# Patient Record
Sex: Female | Born: 1990 | Race: White | Hispanic: No | Marital: Single | Smoking: Never smoker
Health system: Southern US, Community
[De-identification: ages and names within clinical notes are randomized; demographics above are authoritative.]

---

## 2011-11-08 HISTORY — PX: OTHER SURGICAL HISTORY: SHX169

## 2013-12-28 ENCOUNTER — Encounter (HOSPITAL_COMMUNITY): Payer: Self-pay | Admitting: Emergency Medicine

## 2013-12-28 ENCOUNTER — Emergency Department (HOSPITAL_COMMUNITY): Payer: Managed Care, Other (non HMO)

## 2013-12-28 ENCOUNTER — Emergency Department (HOSPITAL_COMMUNITY)
Admission: EM | Admit: 2013-12-28 | Discharge: 2013-12-28 | Disposition: A | Discharge status: Home / Self Care | Payer: Managed Care, Other (non HMO) | Attending: Emergency Medicine | Admitting: Emergency Medicine

## 2013-12-28 DIAGNOSIS — S060X0A Concussion without loss of consciousness, initial encounter: Secondary | ICD-10-CM | POA: Insufficient documentation

## 2013-12-28 DIAGNOSIS — Z3202 Encounter for pregnancy test, result negative: Secondary | ICD-10-CM | POA: Insufficient documentation

## 2013-12-28 DIAGNOSIS — Z79899 Other long term (current) drug therapy: Secondary | ICD-10-CM | POA: Insufficient documentation

## 2013-12-28 DIAGNOSIS — R110 Nausea: Secondary | ICD-10-CM | POA: Insufficient documentation

## 2013-12-28 LAB — POC URINE PREG, ED: Preg Test, Ur: NEGATIVE

## 2013-12-28 MED ORDER — IBUPROFEN 800 MG PO TABS
800.0000 mg | ORAL_TABLET | Freq: Once | ORAL | Status: AC
  Administered 2013-12-28: 800 mg via ORAL
  Filled 2013-12-28: qty 1
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
## 2013-12-28 NOTE — Discharge Instructions (Signed)
Read the information below.  You may return to the Emergency Department at any time for worsening condition or any new symptoms that concern you.  You may take tylenol or ibuprofen as needed for pain.  If you decide to press charges, please call the police; if you feel unsafe at any time, please call 911 or you may return to the ER.  Use the resources below if you find them helpful.  If you develop uncontrolled pain, vomiting, are unable to speak or walk, you pass out, or develop weakness or numbness of the arms or legs, return to the ER immediately for a recheck.     Assault, General Assault includes any behavior, whether intentional or reckless, which results in bodily injury to another person and/or damage to property. Included in this would be any behavior, intentional or reckless, that by its nature would be understood (interpreted) by a reasonable person as intent to harm another person or to damage his/her property. Threats may be oral or written. They may be communicated through regular mail, computer, fax, or phone. These threats may be direct or implied. FORMS OF ASSAULT INCLUDE:  Physically assaulting a person. This includes physical threats to inflict physical harm as well as:  Slapping.  Hitting.  Poking.  Kicking.  Punching.  Pushing.  Arson.  Sabotage.  Equipment vandalism.  Damaging or destroying property.  Throwing or hitting objects.  Displaying a weapon or an object that appears to be a weapon in a threatening manner.  Carrying a firearm of any kind.  Using a weapon to harm someone.  Using greater physical size/strength to intimidate another.  Making intimidating or threatening gestures.  Bullying.  Hazing.  Intimidating, threatening, hostile, or abusive language directed toward another person.  It communicates the intention to engage in violence against that person. And it leads a reasonable person to expect that violent behavior may  occur.  Stalking another person. IF IT HAPPENS AGAIN:  Immediately call for emergency help (911 in [place].S[place]  If someone poses clear and immediate danger to you, seek legal authorities to have a protective or restraining order put in place.  Less threatening assaults can at least be reported to authorities. STEPS TO TAKE IF A SEXUAL ASSAULT HAS HAPPENED  Go to an area of safety. This may include a shelter or staying with a friend. Stay away from the area where you have been attacked. A large percentage of sexual assaults are caused by a friend, relative or associate.  If medications were given by your caregiver, take them as directed for the full length of time prescribed.  Only take over-the-counter or prescription medicines for pain, discomfort, or fever as directed by your caregiver.  If you have come in contact with a sexual disease, find out if you are to be tested again. If your caregiver is concerned about the HIV/AIDS virus, he/she may require you to have continued testing for several months.  For the protection of your privacy, test results can not be given over the phone. Make sure you receive the results of your test. If your test results are not back during your visit, make an appointment with your caregiver to find out the results. Do not assume everything is normal if you have not heard from your caregiver or the medical facility. It is important for you to follow up on all of your test results.  File appropriate papers with authorities. This is important in all assaults, even if it has occurred in a  family or by a friend. SEEK MEDICAL CARE IF:  You have new problems because of your injuries.  You have problems that may be because of the medicine you are taking, such as:  Rash.  Itching.  Swelling.  Trouble breathing.  You develop belly (abdominal) pain, feel sick to your stomach (nausea) or are vomiting.  You begin to run a temperature.  You need supportive care  or referral to a rape crisis center. These are centers with trained personnel who can help you get through this ordeal. SEEK IMMEDIATE MEDICAL CARE IF:  You are afraid of being threatened, beaten, or abused. In [place]., call 911.  You receive new injuries related to abuse.  You develop severe pain in any area injured in the assault or have any change in your condition that concerns you.  You faint or lose consciousness.  You develop chest pain or shortness of breath. Document Released: 10/24/2005 Document Revised: 01/16/2012 Document Reviewed: 06/11/2008 [facility] Patient Information 2014 [place], [place].  Concussion, Adult A concussion, or closed-head injury, is a brain injury caused by a direct blow to the head or by a quick and sudden movement (jolt) of the head or neck. Concussions are usually not life-threatening. Even so, the effects of a concussion can be serious. If you have had a concussion before, you are more likely to experience concussion-like symptoms after a direct blow to the head.  CAUSES   Direct blow to the head, such as from running into another player during a soccer game, being hit in a fight, or hitting your head on a hard surface.  A jolt of the head or neck that causes the brain to move back and forth inside the skull, such as in a car crash. SIGNS AND SYMPTOMS  The signs of a concussion can be hard to notice. Early on, they may be missed by you, family members, and health care providers. You may look fine but act or feel differently. Symptoms are usually temporary, but they may last for days, weeks, or even longer. Some symptoms may appear right away while others may not show up for hours or days. Every head injury is different. Symptoms include:   Mild to moderate headaches that will not go away.  A feeling of pressure inside your head.  Having more trouble than usual:   Learning or remembering things you have heard.  Answering questions.  Paying  attention or concentrating.   Organizing daily tasks.   Making decisions and solving problems.   Slowness in thinking, acting or reacting, speaking, or reading.   Getting lost or being easily confused.   Feeling tired all the time or lacking energy (fatigued).   Feeling drowsy.   Sleep disturbances.   Sleeping more than usual.   Sleeping less than usual.   Trouble falling asleep.   Trouble sleeping (insomnia).   Loss of balance or feeling lightheaded or dizzy.   Nausea or vomiting.   Numbness or tingling.   Increased sensitivity to:   Sounds.   Lights.   Distractions.   Vision problems or eyes that tire easily.   Diminished sense of taste or smell.   Ringing in the ears.   Mood changes such as feeling sad or anxious.   Becoming easily irritated or angry for little or no reason.   Lack of motivation.  Seeing or hearing things other people do not see or hear (hallucinations). DIAGNOSIS  Your health care provider can usually diagnose a concussion based on a  description of your injury and symptoms. He or she will ask whether you passed out (lost consciousness) and whether you are having trouble remembering events that happened right before and during your injury.  Your evaluation might include:   A brain scan to look for signs of injury to the brain. Even if the test shows no injury, you may still have a concussion.   Blood tests to be sure other problems are not present. TREATMENT   Concussions are usually treated in an emergency department, in urgent care, or at a clinic. You may need to stay in the hospital overnight for further treatment.   Tell your health care provider if you are taking any medicines, including prescription medicines, over-the-counter medicines, and natural remedies. Some medicines, such as blood thinners (anticoagulants) and aspirin, may increase the chance of complications. Also tell your health care provider  whether you have had alcohol or are taking illegal drugs. This information may affect treatment.  Your health care provider will send you home with important instructions to follow.  How fast you will recover from a concussion depends on many factors. These factors include how severe your concussion is, what part of your brain was injured, your age, and how healthy you were before the concussion.  Most people with mild injuries recover fully. Recovery can take time. In general, recovery is slower in older persons. Also, persons who have had a concussion in the past or have other medical problems may find that it takes longer to recover from their current injury. HOME CARE INSTRUCTIONS  General Instructions  Carefully follow the directions your health care provider gave you.  Only take over-the-counter or prescription medicines for pain, discomfort, or fever as directed by your health care provider.  Take only those medicines that your health care provider has approved.  Do not drink alcohol until your health care provider says you are well enough to do so. Alcohol and certain other drugs may slow your recovery and can put you at risk of further injury.  If it is harder than usual to remember things, write them down.  If you are easily distracted, try to do one thing at a time. For example, do not try to watch TV while fixing dinner.  Talk with family members or close friends when making important decisions.  Keep all follow-up appointments. Repeated evaluation of your symptoms is recommended for your recovery.  Watch your symptoms and tell others to do the same. Complications sometimes occur after a concussion. Older adults with a brain injury may have a higher risk of serious complications such as of a blood clot on the brain.  Tell your teachers, school nurse, school counselor, coach, athletic trainer, or work Production designer, theatre/television/film about your injury, symptoms, and restrictions. Tell them about what  you can or cannot do. They should watch for:   Increased problems with attention or concentration.   Increased difficulty remembering or learning new information.   Increased time needed to complete tasks or assignments.   Increased irritability or decreased ability to cope with stress.   Increased symptoms.   Rest. Rest helps the brain to heal. Make sure you:  Get plenty of sleep at night. Avoid staying up late at night.  Keep the same bedtime hours on weekends and weekdays.  Rest during the day. Take daytime naps or rest breaks when you feel tired.  Limit activities that require a lot of thought or concentration. These includes   Doing homework or job-related work.  Watching TV.   Working on the computer.  Avoid any situation where there is potential for another head injury (football, hockey, soccer, basketball, martial arts, downhill snow sports and horseback riding). Your condition will get worse every time you experience a concussion. You should avoid these activities until you are evaluated by the appropriate follow-up caregivers. Returning To Your Regular Activities You will need to return to your normal activities slowly, not all at once. You must give your body and brain enough time for recovery.  Do not return to sports or other athletic activities until your health care provider tells you it is safe to do so.  Ask your health care provider when you can drive, ride a bicycle, or operate heavy machinery. Your ability to react may be slower after a brain injury. Never do these activities if you are dizzy.  Ask your health care provider about when you can return to work or school. Preventing Another Concussion It is very important to avoid another brain injury, especially before you have recovered. In rare cases, another injury can lead to permanent brain damage, brain swelling, or death. The risk of this is greatest during the first 7 10 days after a head  injury. Avoid injuries by:   Wearing a seat belt when riding in a car.   Drinking alcohol only in moderation.   Wearing a helmet when biking, skiing, skateboarding, skating, or doing similar activities.  Avoiding activities that could lead to a second concussion, such as contact or recreational sports, until your health care provider says it is OK.  Taking safety measures in your home.   Remove clutter and tripping hazards from floors and stairways.   Use grab bars in bathrooms and handrails by stairs.   Place non-slip mats on floors and in bathtubs.   Improve lighting in dim areas. SEEK MEDICAL CARE IF:   You have increased problems paying attention or concentrating.   You have increased difficulty remembering or learning new information.   You need more time to complete tasks or assignments than before.   You have increased irritability or decreased ability to cope with stress.  You have more symptoms than before. Seek medical care if you have any of the following symptoms for more than 2 weeks after your injury:   Lasting (chronic) headaches.   Dizziness or balance problems.   Nausea.  Vision problems.   Increased sensitivity to noise or light.   Depression or mood swings.   Anxiety or irritability.   Memory problems.   Difficulty concentrating or paying attention.   Sleep problems.   Feeling tired all the time. SEEK IMMEDIATE MEDICAL CARE IF:   You have severe or worsening headaches. These may be a sign of a blood clot in the brain.  You have weakness (even if only in one hand, leg, or part of the face).  You have numbness.  You have decreased coordination.   You vomit repeatedly.  You have increased sleepiness.  One pupil is larger than the other.   You have convulsions.   You have slurred speech.   You have increased confusion. This may be a sign of a blood clot in the brain.  You have increased restlessness,  agitation, or irritability.   You are unable to recognize people or places.   You have neck pain.   It is difficult to wake you up.   You have unusual behavior changes.   You lose consciousness. MAKE SURE YOU:   Understand  these instructions.  Will watch your condition.  Will get help right away if you are not doing well or get worse. Document Released: 01/14/2004 Document Revised: 06/26/2013 Document Reviewed: 05/16/2013 [facility] Patient Information 2014 [place], [place].   Behavioral Health Resources in the [facility]  Intensive Outpatient Programs: [facility]      [address] [place], [place] [phone] Both a day and evening program       [facility] Outpatient     [address]        [place], [place] [address] [phone]         ADS: [facility] [address] [place] [phone]  [facility] Mental Health ACCESS LINE: [phone] or [phone] [address] [place], [place] [address] [email or web address]  Mobile Crisis Teams:                                        Therapeutic Alternatives         Mobile Crisis Care Unit [phone]             [facility] [address] Ginette Otto [phone]                                         Interventionist [address] De[place] [place] [phone]  Self-Help/Support Groups: Mental Health Assoc. of [employer] of support groups [phone] (call for more info)  Narcotics Anonymous (NA) Caring Services [address] [place] - 2 meetings at this location  Residential Treatment Programs:  [facility]      [address]        [place]       [phone]         [facility] [address], [place] [address] [place], [place]  [address] [phone]  [facility]  [address] [place], [place] [address] [phone] Admissions: 8am-3pm M-F  [facility]     [address]        [place], [place] [address]       [phone]         [facility] [address] [place], [place] [phone]  [facility] [address] [place], [place] [phone]  [facility] - Intensive Outpatient      [address]     [place], [place]       [phone]         [facility] (Addiction [facility]     [address] [place], [place] [phone] or [phone]  [facility] Treatment Services (RTS)  [address] [place], [place] [phone]  [facility] [address]                                               [address] [place] [phone]  [facility] Resources: [phone]               General Therapy  Angie Fava, PhD        [address] [place], [place] [address]         [phone]   Insurance  [facility]   [address] [place], [place] [address] [phone]  [facility] Recovery [address] [place], [place] [address] [phone] Insurance/Medicaid/sponsorship through [facility]                                              [address]                                        [place], [place] [address]    Therapy/tele-psych/case         [phone]          [facility] ([address]) [address], [place]  [address]  Adolescent/group home/case management [phone]                                           Creola Corn PhD       General therapy       Insurance   [phone]         Dr. Lolly Mustache Insurance [phone] M-F  [facility]/Residential Medicaid, sponsorship [phone]    Emergency Department Resource Guide 1) Find a Doctor and Pay Out of Pocket Although you won't  have to find out who is covered by your insurance plan, it is a good idea to ask around and get recommendations. You will then need to call the office and see if the doctor you have chosen will accept you as a new patient and what types of options they offer for patients who are self-pay. Some doctors offer discounts or will set up payment plans for their patients who do not have insurance, but you will need to ask so you aren't surprised when you get to your appointment.  2) Contact Your Local Health Department Not all health departments have doctors that can see patients for sick visits, but many do, so it is worth a call to see if yours does. If you don't know where your local health department is, you can check in your phone book. The CDC also has a tool to help you locate your state's health department, and many state websites also have listings of all of their local health departments.  3) Find a Walk-in Clinic If your illness is not likely to be very severe or complicated, you may want to try a walk in clinic. These are popping up all over the country in pharmacies, drugstores, and shopping centers. They're usually staffed by nurse practitioners or physician assistants that have been trained to treat common illnesses and complaints. They're usually fairly quick and inexpensive. However, if you have serious medical issues or chronic medical problems, these are probably not your best option.  No Primary Care Doctor: - Call Health Connect at  [phone] - they  can help you locate a primary care doctor that  accepts your insurance, provides certain services, etc. - Physician Referral Service- [phone]  Chronic Pain Problems: Organization         Address  Phone   Notes  [facility]  [phone] Patients need to be referred by their primary care doctor.   Medication Assistance: Organization         Address  Phone   Notes  [facility] Medication [facility]  [address] [place] [place], [place] [address] [phone] --Must be a resident of [facility] -- Must have NO insurance coverage whatsoever (no Medicaid/ Medicare, etc.) -- The pt. MUST have a primary care doctor that directs their care regularly and follows them in the community   Med[facility]ist  [phone]   [employer]  [phone]    Agencies that provide inexpensive medical care: Organization         Address  Phone   Notes  [facility]  [phone]   [facility]    [phone]   [facility] [address] [place], [place] [address] [phone]   [facility] [address] [place], [place] [phone]   Planned Parenthood    [phone]   [facility]    [phone]   [facility]  [address], [place] Phone:  [phone], Fax:  [phone] Hours of Operation:  9 am - 6 pm, M-F.  Also accepts Medicaid/Medicare and self-pay.  [facility]  [address], [place] Phone: [phone], Fax: [phone]. Hours of Operation:  8:30 am - 5:30 pm, M-F.  Also accepts Medicaid and self-pay.  [facility] [place] [address], [place] Phone: [phone]   [facility] [address] Natasha Bence [place], [place] [phone], Ext. [phone] Mondays & Thursdays: 7-9 AM.  First 15 patients are seen on a first come, first serve basis.    Medicaid-accepting [facility] Providers:  Organization         Address  Phone   Notes  [facility] [address], [place] [phone] Also accepts self-pay patients.  [facility] [address] Laurell Josephs [place], [place]  [phone]   [facility] [place] [facility] [address], [place] [phone]   [facility] [address] [place] [phone]    Renaye Rakers [address], [place]   [phone] Only accepts [place] Access [place] patients after they have their name applied to their card.   Self-Pay (no insurance) in [facility]:  Organization         Address  Phone   Notes  Sickle Cell Patients, [facility] [address] [place], [place] [phone]   [facility] [address] [place], [place] [phone]   [facility]  [address], [place] [phone]   [facility]/Dr. Osei-Bonsu  [address] [place] or [address], [place] [phone] Phone number for both [place] and [place] locations is the same.  [facility]gent Medical and [facility] [address], [place] [phone]   [facility] [address] [place] or [address] [phone]   [facility] [address], [place] [phone], phone; [phone], fax Sees patients 1st and 3rd Saturday of every month.  Must not qualify for public or private insurance (i.e. Medicaid, Medicare, [place] Health Choice, Veterans' Benefits)  Household income should be no more than 200% of the poverty level The clinic cannot treat you if you are pregnant or think you are pregnant  Sexually transmitted diseases are not treated at the clinic.    Dental Care: Organization         Address  Phone  Notes  [facility] Department of [facility] [address] [place], [place] [phone] Accepts children up to age 33 who are enrolled in [place] or [place] Health Choice; pregnant women with a Medicaid card; and children who have applied for Medicaid or [place] Health Choice, but were declined, whose parents can pay a reduced fee at time of service.  [facility] Department of [facility]  [address], [place] [phone] Accepts children  up to age 64 who are enrolled in [place] or [place] Health Choice; pregnant women with a Medicaid card; and children who have applied for Medicaid or [place] Health Choice, but were declined, whose parents can pay a reduced fee at time of service.  [facility]  [address] [place], [place] [phone] Patients are seen by appointment only. Walk-ins are not accepted. [facility] will see patients 2 years of age and older. Monday - Tuesday (8am-5pm) Most Wednesdays (8:30-5pm) $30 per visit, cash only  [facility]  [address], [facility] [phone] Patients are seen by appointment only. Walk-ins are not accepted. [facility] will see patients 19 years of age and older. One Wednesday Evening (Monthly: Volunteer Based).  $30 per visit, cash only  [employer]  [phone] for adults; Children under age 101, call Graduate Pediatric Dentistry at [phone]. Children aged 57-14, please call [phone] to request a pediatric application.  Dental services are provided in all areas of dental care including fillings, crowns and bridges, complete and partial dentures, implants, gum treatment, root canals, and extractions. Preventive care is also provided. Treatment is provided to both adults and children. Patients are selected via a lottery and there is often a waiting list.   [facility] [address] [place]  [phone] [email or web address]   [facility] [address] [place], [place] [phone], Ext. [phone] Second and Fourth Thursday of each month, opens at 6:30 AM; Clinic ends at 9 AM.  Patients are seen on a first-come first-served basis, and a limited number are seen during each clinic.   [facility]  [address] [place], [place] [phone]   Eligibility Requirements You must have lived in [place], [place], or [place] counties for at least the last  three months.   You cannot be eligible for state or federal sponsored [place], including [employer], [place], or [employer].   You generally cannot be eligible for healthcare insurance through your employer.    How to apply: Eligibility screenings are held every Tuesday and Wednesday afternoon from 1:00 pm until 4:00 pm. You do not need an appointment for the interview!  [facility] [address], [place], [place] [phone]   [facility] Health Department  [phone]   [facility]  Department  [phone]   [facility] Health Department  [phone]    Behavioral Health Resources in the Community: Intensive Outpatient Programs Organization         Address  Phone  Notes  [facility] [address], [place], [place] [phone]   [facility] Outpatient [address], [place], [place] [phone]   [facility] [address] [place], [place]  [phone]   [facility] Mental Health [address]  [place], [place] [phone] or [phone]   Substance Abuse Resources Organization         Address  Phone  Notes  [facility]  [phone]   [facility]  [phone]   [facility] [place]  [phone]   Floydene Flock  [phone]   Residential & Outpatient Substance Abuse Program  [phone]   Psychological Services Organization         Address  Phone  Notes  [facility] Behavioral Health  [phone]   [facility] Services  [phone]   [facility] Mental Health [address] [place] [phone] or [phone]    Mobile Crisis Teams Organization         Address  Phone  Notes  [facility], Mobile Crisis Care Unit  [phone]   [facility]  [address] [place], [place] [phone]   Doristine Locks [address] [place] [phone]     Self-Help/Support Groups Organization         Address  Phone             Notes  [facility] [place] - variety of support groups  336- I7437963 Call for more information  Narcotics Anonymous (NA), [facility] [address], [employer] [place]  2 meetings at this location   Statistician         Address  Phone  Notes  [facility] [address],    [place]  [phone]   [facility]  [address] [place] [address], [place], [place] [phone]   [facility] [address] [place], [place] [phone] Admissions: 8am-3pm M-F  [facility] [address]    [place], [place] [phone]   [facility] [address] [place], [place], [place] [phone]   [facility] [address],  [place], [place] [phone]   [facility] - Intensive Outpatient [address], [place], [place] [phone]   [facility] ([facility] [address]  [place], [place] [phone] or [phone]   [facility] [address], [place], [place] [phone] Accepts Medicaid  [facility] [address],  [place] [phone] Substance Abuse/Addiction Treatment   [facility] Organization         Address  Phone  Notes  [facility]  [phone]   Angie Fava, PhD [address] [place], [place]   [phone] or [phone]   [facility] Behavioral   [address] [place], [place] [phone]   [facility] [address], [place], [place] [phone] Insurance/Medicaid/sponsorship through [employer] and Families [address]  [place], [place] [phone] [place], [place] [phone]    Dr. Adele Schilder  [phone]   [facility] Dept. 1) [address] [place] 2) [place] 3)  [place] [address], [place] [phone]  [phone]   [place] [phone] or [phone] (After Hours)

## 2013-12-28 NOTE — ED Notes (Signed)
She is awake and mildly drowsy and is oriented x 4 with clear speech.  Her only request at this time is for water, a glass of which I bring her.
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
## 2013-12-29 NOTE — ED Provider Notes (Signed)
Medical screening examination/treatment/procedure(s) were performed by non-physician practitioner and as supervising physician I was immediately available for consultation/collaboration.   Sunnie NielsenBrian Temari Schooler, MD 12/29/13 [phone]

## 2014-10-27 ENCOUNTER — Other Ambulatory Visit (HOSPITAL_COMMUNITY)
Admission: RE | Admit: 2014-10-27 | Discharge: 2014-10-27 | Disposition: A | Discharge status: Home / Self Care | Payer: Managed Care, Other (non HMO) | Source: Ambulatory Visit | Attending: Obstetrics and Gynecology | Admitting: Obstetrics and Gynecology

## 2014-10-27 ENCOUNTER — Other Ambulatory Visit: Payer: Self-pay | Admitting: Obstetrics and Gynecology

## 2014-10-27 DIAGNOSIS — Z113 Encounter for screening for infections with a predominantly sexual mode of transmission: Secondary | ICD-10-CM | POA: Insufficient documentation

## 2014-10-27 DIAGNOSIS — Z01411 Encounter for gynecological examination (general) (routine) with abnormal findings: Secondary | ICD-10-CM | POA: Insufficient documentation

## 2014-10-28 LAB — CYTOLOGY - PAP

## 2015-08-27 ENCOUNTER — Other Ambulatory Visit: Payer: Self-pay | Admitting: Family Medicine

## 2015-08-27 ENCOUNTER — Ambulatory Visit
Admission: RE | Admit: 2015-08-27 | Discharge: 2015-08-27 | Disposition: A | Discharge status: Home / Self Care | Payer: BC Managed Care – PPO | Source: Ambulatory Visit | Attending: Family Medicine | Admitting: Family Medicine

## 2015-08-27 DIAGNOSIS — M5489 Other dorsalgia: Secondary | ICD-10-CM

## 2015-08-27 DIAGNOSIS — M419 Scoliosis, unspecified: Secondary | ICD-10-CM

## 2015-12-28 ENCOUNTER — Other Ambulatory Visit: Payer: Self-pay | Admitting: Obstetrics and Gynecology

## 2015-12-28 ENCOUNTER — Other Ambulatory Visit (HOSPITAL_COMMUNITY)
Admission: RE | Admit: 2015-12-28 | Discharge: 2015-12-28 | Disposition: A | Discharge status: Home / Self Care | Payer: BC Managed Care – PPO | Source: Ambulatory Visit | Attending: Obstetrics and Gynecology | Admitting: Obstetrics and Gynecology

## 2015-12-28 DIAGNOSIS — Z01411 Encounter for gynecological examination (general) (routine) with abnormal findings: Secondary | ICD-10-CM | POA: Diagnosis present

## 2015-12-28 DIAGNOSIS — R87810 Cervical high risk human papillomavirus (HPV) DNA test positive: Secondary | ICD-10-CM | POA: Insufficient documentation

## 2015-12-28 DIAGNOSIS — Z1151 Encounter for screening for human papillomavirus (HPV): Secondary | ICD-10-CM | POA: Diagnosis present

## 2015-12-31 LAB — CYTOLOGY - PAP

## 2016-02-01 ENCOUNTER — Other Ambulatory Visit: Payer: Self-pay | Admitting: Obstetrics and Gynecology

## 2016-05-24 ENCOUNTER — Other Ambulatory Visit: Payer: Self-pay | Admitting: Family Medicine

## 2016-05-24 ENCOUNTER — Ambulatory Visit
Admission: RE | Admit: 2016-05-24 | Discharge: 2016-05-24 | Disposition: A | Discharge status: Home / Self Care | Payer: BC Managed Care – PPO | Source: Ambulatory Visit | Attending: Family Medicine | Admitting: Family Medicine

## 2016-05-24 DIAGNOSIS — M79672 Pain in left foot: Secondary | ICD-10-CM

## 2016-05-24 DIAGNOSIS — M25572 Pain in left ankle and joints of left foot: Secondary | ICD-10-CM

## 2016-06-19 ENCOUNTER — Emergency Department (HOSPITAL_COMMUNITY)
Admission: EM | Admit: 2016-06-19 | Discharge: 2016-06-20 | Disposition: A | Discharge status: Home / Self Care | Payer: BC Managed Care – PPO | Attending: Emergency Medicine | Admitting: Emergency Medicine

## 2016-06-19 ENCOUNTER — Encounter (HOSPITAL_COMMUNITY): Payer: Self-pay

## 2016-06-19 ENCOUNTER — Emergency Department (HOSPITAL_COMMUNITY): Payer: BC Managed Care – PPO

## 2016-06-19 DIAGNOSIS — Y929 Unspecified place or not applicable: Secondary | ICD-10-CM | POA: Insufficient documentation

## 2016-06-19 DIAGNOSIS — S0990XA Unspecified injury of head, initial encounter: Secondary | ICD-10-CM | POA: Insufficient documentation

## 2016-06-19 DIAGNOSIS — Y999 Unspecified external cause status: Secondary | ICD-10-CM | POA: Diagnosis not present

## 2016-06-19 DIAGNOSIS — Y939 Activity, unspecified: Secondary | ICD-10-CM | POA: Insufficient documentation

## 2016-06-19 DIAGNOSIS — Z79899 Other long term (current) drug therapy: Secondary | ICD-10-CM | POA: Insufficient documentation

## 2016-06-19 DIAGNOSIS — W01119A Fall on same level from slipping, tripping and stumbling with subsequent striking against unspecified sharp object, initial encounter: Secondary | ICD-10-CM | POA: Diagnosis not present

## 2016-06-19 NOTE — ED Triage Notes (Signed)
Pt states that she tripped in her bathroom and hit her head on the faucet around 1100. Denies any bleeding but endorses dizziness, nausea, and headache that has not gotten better. Pt took some naproxen at home without relief. A&Ox4. Pupils equal and reactive in triage.

## 2016-06-19 NOTE — ED Provider Notes (Signed)
[facility]-EMERGENCY DEPT Provider Note   CSN: [id number] Arrival date & time: 06/19/16  2002   By signing my name below, I, Phillis Haggis, attest that this documentation has been prepared under the direction and in the presence of Geoffery Lyons, MD. Electronically Signed: Phillis Haggis, ED Scribe. 06/19/16. 11:24 PM.  First Provider Contact:  First MD Initiated Contact with Patient 06/19/16 2319     History   Chief Complaint Chief Complaint  Patient presents with  . Fall  . Dizziness   The history is provided by the patient. No language interpreter was used.  HPI Comments: Yesenia Tucker is a 25 y.o. female who presents to the Emergency Department complaining of gradually worsening dizziness onset 12 hours ago. Pt reports associated headache and nausea. She states that she tripped in the bathroom and hit the left temple of her head on a faucet. She has been taking Naproxen for her symptoms to no relief. She denies LOC, wound, numbness, or weakness.   History reviewed. No pertinent past medical history.  There are no active problems to display for this patient.   Past Surgical History:  Procedure Laterality Date  . wisdom teeth Bilateral 2013   x 2    OB History    No data available     Home Medications    Prior to Admission medications   Medication Sig Start Date End Date Taking? Authorizing Provider  BIOTIN PO Take 1 tablet by mouth daily.    Historical Provider, MD  etonogestrel-ethinyl estradiol (NUVARING) 0.12-0.015 MG/24HR vaginal ring Place 1 each vaginally every 28 (twenty-eight) days. Insert vaginally and leave in place for 3 consecutive weeks, then remove for 1 week.    Historical Provider, MD  ferrous sulfate 325 (65 FE) MG tablet Take 325 mg by mouth daily.    Historical Provider, MD  methylphenidate (RITALIN) 20 MG tablet Take 20 mg by mouth daily.    Historical Provider, MD  Multiple Vitamin (MULTIVITAMIN WITH MINERALS) TABS tablet Take 1 tablet by mouth daily.     Historical Provider, MD    Family History History reviewed. No pertinent family history.  Social History Social History  Substance Use Topics  . Smoking status: Never Smoker  . Smokeless tobacco: Never Used  . Alcohol use Yes     Allergies   Review of patient's allergies indicates no known allergies.   Review of Systems Review of Systems  Gastrointestinal: Positive for nausea.  Skin: Negative for wound.  Neurological: Positive for dizziness and headaches. Negative for syncope, weakness and numbness.  All other systems reviewed and are negative.   Physical Exam Updated Vital Signs BP 118/65 (BP Location: Left Arm)   Pulse 72   Temp 99.3 F (37.4 C) (Oral)   Resp 20   Ht  (1.575 m)   Wt 205 lb (93 kg)   SpO2 100%   BMI 37.49 kg/m   Physical Exam  Constitutional: She is oriented to person, place, and time. She appears well-developed and well-nourished. No distress.  HENT:  Head: Normocephalic.  Mild soft tissue swelling over the left temple  Eyes: EOM are normal. Pupils are equal, round, and reactive to light.  Neck: Normal range of motion.  Cardiovascular: Normal rate, regular rhythm and normal heart sounds.   Pulmonary/Chest: Effort normal and breath sounds normal.  Abdominal: Soft. She exhibits no distension. There is no tenderness.  Musculoskeletal: Normal range of motion.  Neurological: She is alert and oriented to person, place, and time. She  has normal strength. No cranial nerve deficit or sensory deficit. She exhibits normal muscle tone. Coordination normal.  Skin: Skin is warm and dry.  Psychiatric: She has a normal mood and affect. Judgment normal.  Nursing note and vitals reviewed.    ED Treatments / Results  DIAGNOSTIC STUDIES: Oxygen Saturation is 100% on RA, normal by my interpretation.    COORDINATION OF CARE: 11:23 PM-Discussed treatment plan which includes CT scan with pt at bedside and pt agreed to plan.    Labs (all labs  ordered are listed, but only abnormal results are displayed) Labs Reviewed - No data to display  EKG  EKG Interpretation None       Radiology Ct Head Wo Contrast  Result Date: 06/20/2016 CLINICAL DATA:  Initial evaluation for acute trauma, fall. EXAM: CT HEAD WITHOUT CONTRAST TECHNIQUE: Contiguous axial images were obtained from the base of the skull through the vertex without intravenous contrast. COMPARISON:  None. FINDINGS: Scalp soft tissues demonstrate no acute abnormality. No acute abnormality about the globes and orbits. Paranasal sinuses are clear.  No mastoid effusion. Calvarium intact. Cerebral volume within normal limits. No evidence for acute intracranial hemorrhage or large vessel territory infarct. No mass lesion, midline shift or mass effect. No hydrocephalus. No extra-axial fluid collection. IMPRESSION: Normal head CT.  No acute intracranial process identified. Electronically Signed   By: Rise MuBenjamin  McClintock M.D.   On: 06/20/2016 00:39    Procedures Procedures (including critical care time)  Medications Ordered in ED Medications - No data to display   Initial Impression / Assessment and Plan / ED Course  I have reviewed the triage vital signs and the nursing notes.  Pertinent labs & imaging results that were available during my care of the patient were reviewed by me and considered in my medical decision making (see chart for details).  Clinical Course      Final Clinical Impressions(s) / ED Diagnoses   Final diagnoses:  None   Head CT is negative and the patient is neurologically intact. She will be discharged with a diagnosis of a concussion. She is to return as needed for any problems.   I personally performed the services described in this documentation, which was scribed in my presence. The recorded information has been reviewed and is accurate.     New Prescriptions New Prescriptions   No medications on file     Geoffery Lyonsouglas Terri Malerba, MD 06/20/16  [phone]

## 2016-06-19 NOTE — ED Notes (Signed)
Pt reports tripping in bathroom and hitting left temple on bathtub faucet that occurred at 1100 today. Pt denies any LOC after incident. Pt reported to take Naproxen after incident and had some nausea.

## 2016-06-20 NOTE — Discharge Instructions (Signed)
Return to the emergency department if you develop severe headache, seizure activity, vomiting, or other new and concerning symptoms.

## 2016-11-08 ENCOUNTER — Other Ambulatory Visit (HOSPITAL_COMMUNITY)
Admission: RE | Admit: 2016-11-08 | Discharge: 2016-11-08 | Disposition: A | Discharge status: Home / Self Care | Payer: BC Managed Care – PPO | Source: Ambulatory Visit | Attending: Obstetrics and Gynecology | Admitting: Obstetrics and Gynecology

## 2016-11-08 ENCOUNTER — Other Ambulatory Visit: Payer: Self-pay | Admitting: Obstetrics and Gynecology

## 2016-11-08 DIAGNOSIS — Z1151 Encounter for screening for human papillomavirus (HPV): Secondary | ICD-10-CM | POA: Insufficient documentation

## 2016-11-08 DIAGNOSIS — Z01411 Encounter for gynecological examination (general) (routine) with abnormal findings: Secondary | ICD-10-CM | POA: Insufficient documentation

## 2016-11-14 LAB — CYTOLOGY - PAP
Diagnosis: HIGH — AB
HPV (WINDOPATH): DETECTED — AB

## 2016-12-01 ENCOUNTER — Other Ambulatory Visit: Payer: Self-pay | Admitting: Obstetrics and Gynecology

## 2017-02-20 ENCOUNTER — Other Ambulatory Visit: Payer: Self-pay | Admitting: Obstetrics and Gynecology

## 2018-01-27 IMAGING — CR DG ANKLE COMPLETE 3+V*L*
3 series · 3 of 3 positions shown · non-contrast
Comparison: None.

CLINICAL DATA: Fall down stairs today. Lateral ankle pain and
swelling. Initial encounter.

EXAM:
LEFT ANKLE COMPLETE - 3+ VIEW

[x ankle lat left]
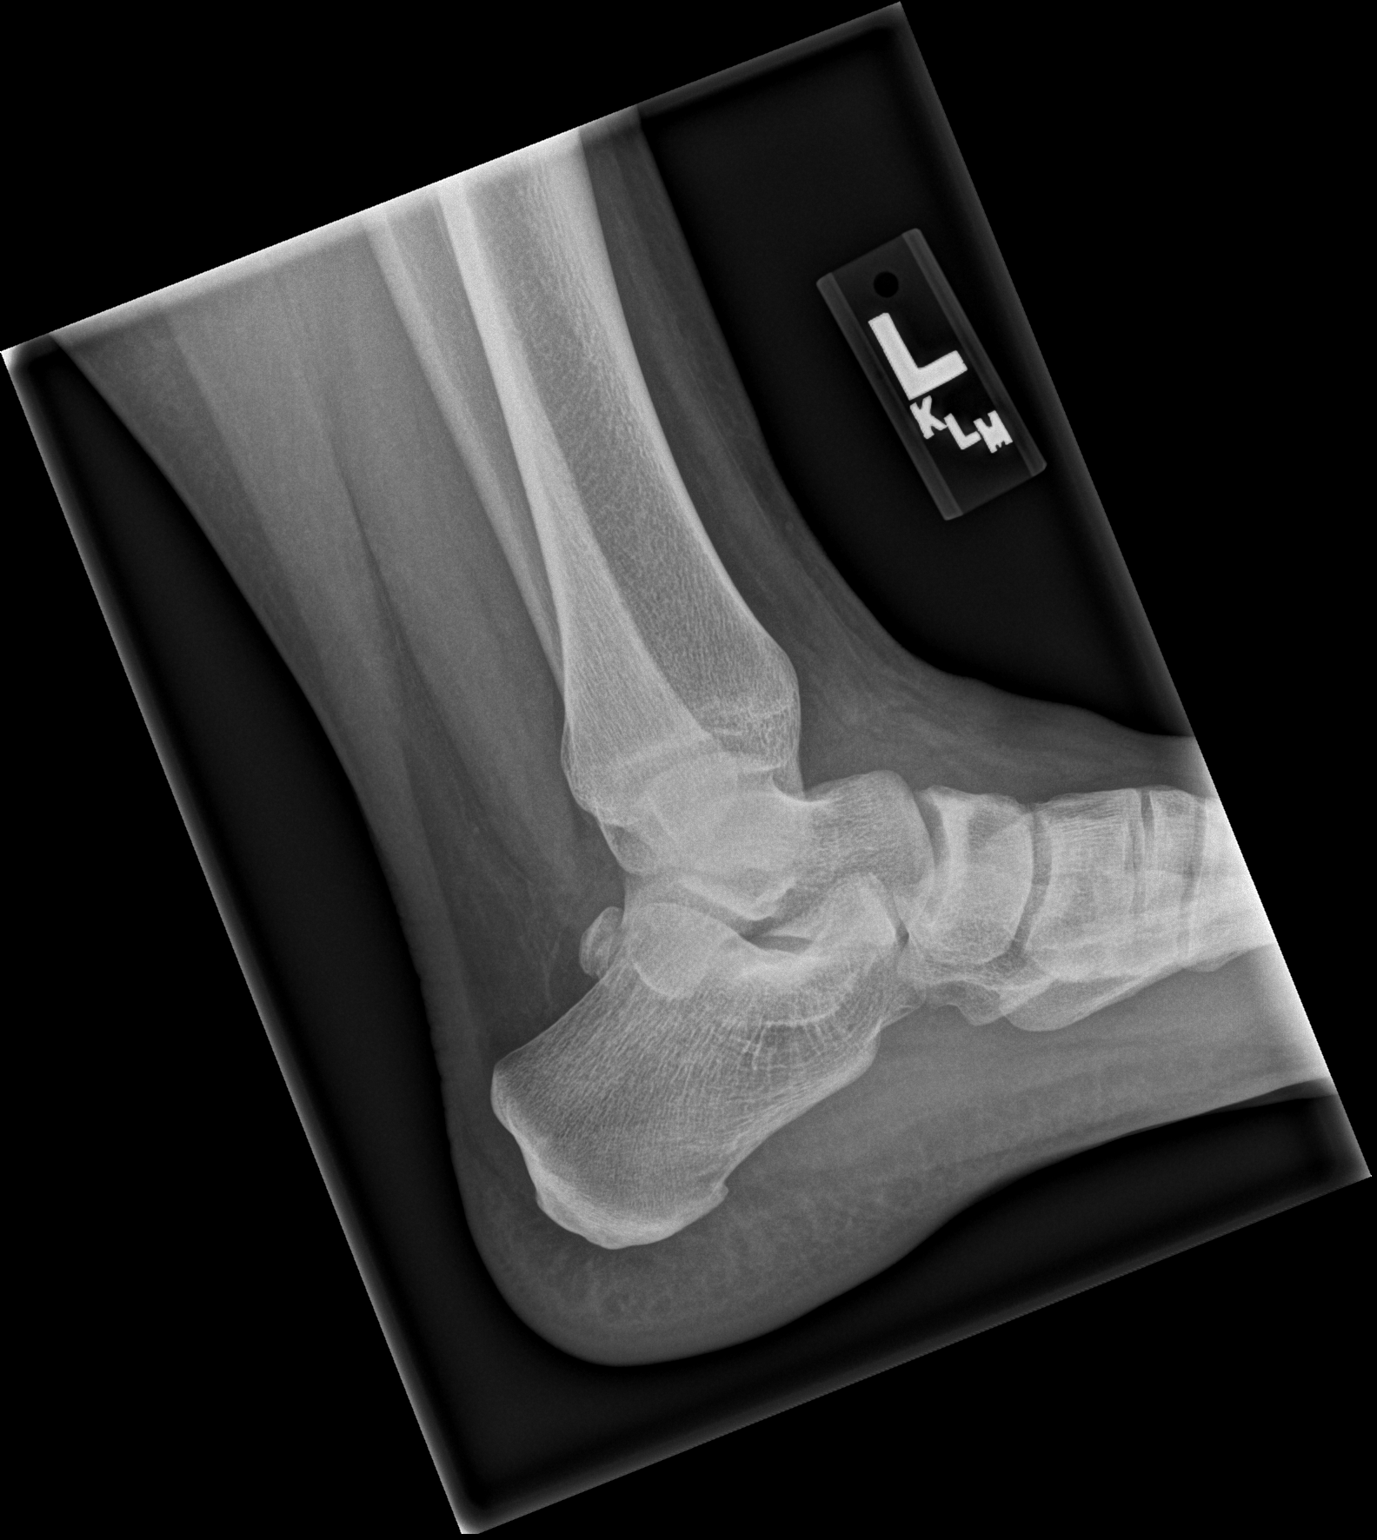

[x ankle ap left]
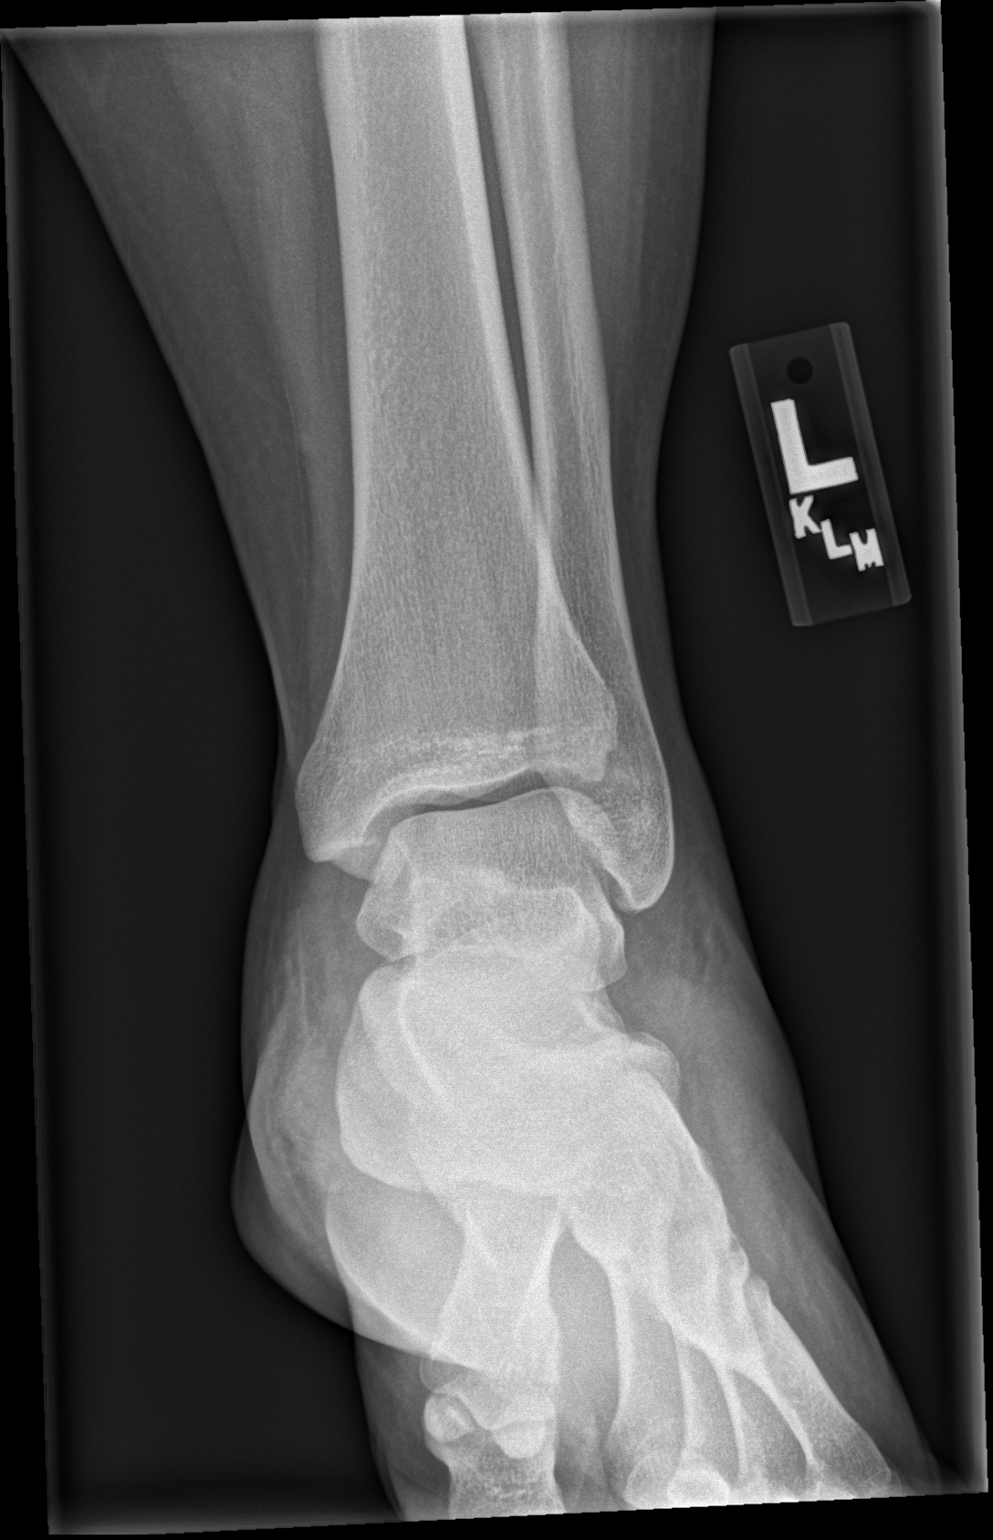

[x ankle obl left]
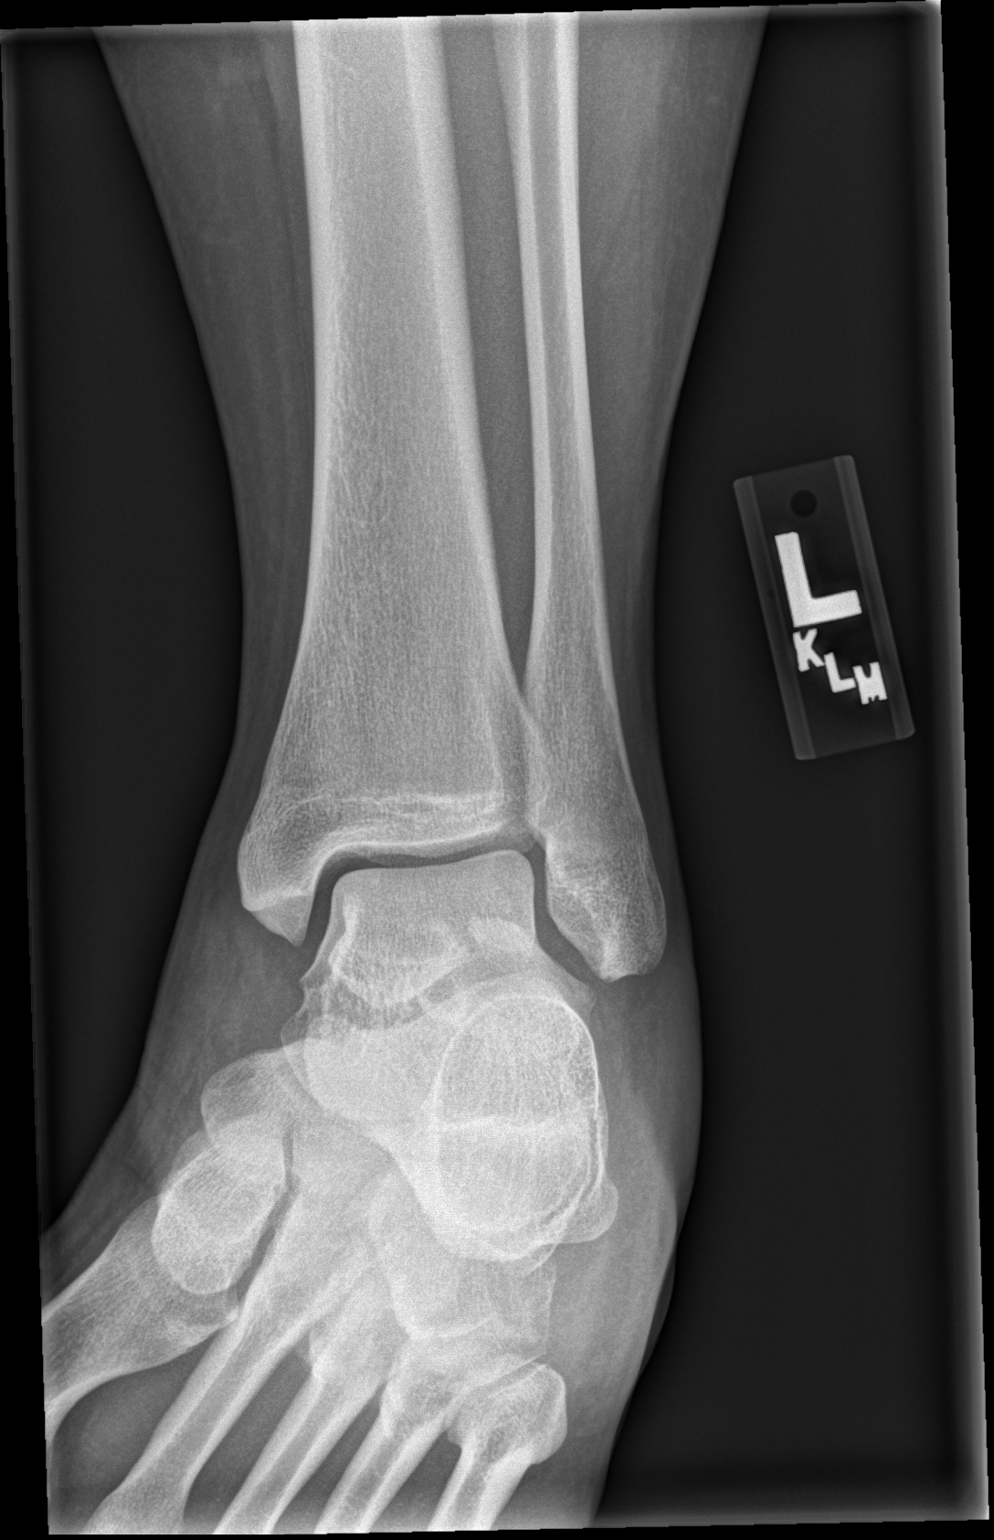

[3 of 3 positions shown; findings below may reference images not displayed]

FINDINGS: There is no evidence of fracture, dislocation, or joint effusion.
There is no evidence of arthropathy or other focal bone abnormality.
Soft tissues are unremarkable.
IMPRESSION: Negative.
